# Patient Record
Sex: Female | Born: 1950
Health system: Southern US, Community
[De-identification: ages and names within clinical notes are randomized; demographics above are authoritative.]

## PROBLEM LIST (undated history)

## (undated) DIAGNOSIS — J309 Allergic rhinitis, unspecified: Secondary | ICD-10-CM

## (undated) DIAGNOSIS — F17200 Nicotine dependence, unspecified, uncomplicated: Secondary | ICD-10-CM

## (undated) DIAGNOSIS — J45909 Unspecified asthma, uncomplicated: Secondary | ICD-10-CM

## (undated) DIAGNOSIS — I10 Essential (primary) hypertension: Secondary | ICD-10-CM

## (undated) HISTORY — DX: Nicotine dependence, unspecified, uncomplicated: F17.200

## (undated) HISTORY — DX: Essential (primary) hypertension: I10

## (undated) HISTORY — DX: Allergic rhinitis, unspecified: J30.9

## (undated) HISTORY — DX: Unspecified asthma, uncomplicated: J45.909

---

## 2004-07-18 ENCOUNTER — Ambulatory Visit: Payer: Self-pay | Admitting: Family Medicine

## 2004-08-18 ENCOUNTER — Ambulatory Visit: Payer: Self-pay | Admitting: Family Medicine

## 2004-10-05 ENCOUNTER — Ambulatory Visit: Payer: Self-pay | Admitting: Family Medicine

## 2004-11-08 ENCOUNTER — Ambulatory Visit: Payer: Self-pay | Admitting: Family Medicine

## 2004-12-12 ENCOUNTER — Ambulatory Visit: Payer: Self-pay | Admitting: Family Medicine

## 2005-02-27 ENCOUNTER — Ambulatory Visit: Payer: Self-pay | Admitting: Family Medicine

## 2005-03-14 ENCOUNTER — Ambulatory Visit: Payer: Self-pay | Admitting: Family Medicine

## 2005-03-30 ENCOUNTER — Emergency Department (HOSPITAL_COMMUNITY): Admission: EM | Admit: 2005-03-30 | Discharge: 2005-03-30 | Payer: Self-pay | Admitting: Emergency Medicine

## 2005-09-04 ENCOUNTER — Ambulatory Visit: Payer: Self-pay | Admitting: Family Medicine

## 2005-10-05 ENCOUNTER — Encounter (INDEPENDENT_AMBULATORY_CARE_PROVIDER_SITE_OTHER): Payer: Self-pay | Admitting: Family Medicine

## 2005-10-05 LAB — CONVERTED CEMR LAB
TSH: 2.616 microintl units/mL
WBC, blood: 5.1 10*3/uL

## 2005-10-09 ENCOUNTER — Ambulatory Visit: Payer: Self-pay | Admitting: Family Medicine

## 2005-10-15 ENCOUNTER — Ambulatory Visit: Payer: Self-pay | Admitting: Family Medicine

## 2005-11-06 ENCOUNTER — Ambulatory Visit: Payer: Self-pay | Admitting: Family Medicine

## 2005-12-18 ENCOUNTER — Ambulatory Visit: Payer: Self-pay | Admitting: Family Medicine

## 2006-01-16 ENCOUNTER — Ambulatory Visit: Payer: Self-pay | Admitting: Family Medicine

## 2006-02-27 ENCOUNTER — Ambulatory Visit: Payer: Self-pay | Admitting: Family Medicine

## 2006-03-27 ENCOUNTER — Encounter: Payer: Self-pay | Admitting: Family Medicine

## 2006-03-27 DIAGNOSIS — J309 Allergic rhinitis, unspecified: Secondary | ICD-10-CM | POA: Insufficient documentation

## 2006-03-27 DIAGNOSIS — I10 Essential (primary) hypertension: Secondary | ICD-10-CM | POA: Insufficient documentation

## 2006-03-27 DIAGNOSIS — J45909 Unspecified asthma, uncomplicated: Secondary | ICD-10-CM | POA: Insufficient documentation

## 2006-03-27 DIAGNOSIS — E785 Hyperlipidemia, unspecified: Secondary | ICD-10-CM

## 2006-04-18 ENCOUNTER — Ambulatory Visit: Payer: Self-pay | Admitting: Family Medicine

## 2006-04-19 ENCOUNTER — Encounter (INDEPENDENT_AMBULATORY_CARE_PROVIDER_SITE_OTHER): Payer: Self-pay | Admitting: Family Medicine

## 2006-08-05 ENCOUNTER — Ambulatory Visit: Payer: Self-pay | Admitting: Family Medicine

## 2006-08-05 LAB — CONVERTED CEMR LAB: LDL Goal: 130 mg/dL

## 2006-11-04 ENCOUNTER — Ambulatory Visit: Payer: Self-pay | Admitting: Family Medicine

## 2006-11-04 ENCOUNTER — Telehealth (INDEPENDENT_AMBULATORY_CARE_PROVIDER_SITE_OTHER): Payer: Self-pay | Admitting: *Deleted

## 2006-11-04 DIAGNOSIS — F17200 Nicotine dependence, unspecified, uncomplicated: Secondary | ICD-10-CM

## 2006-11-05 ENCOUNTER — Encounter (INDEPENDENT_AMBULATORY_CARE_PROVIDER_SITE_OTHER): Payer: Self-pay | Admitting: Family Medicine

## 2006-11-05 ENCOUNTER — Telehealth (INDEPENDENT_AMBULATORY_CARE_PROVIDER_SITE_OTHER): Payer: Self-pay | Admitting: *Deleted

## 2006-11-05 LAB — CONVERTED CEMR LAB
ALT: 9 units/L (ref 0–35)
Albumin: 4.4 g/dL (ref 3.5–5.2)
Basophils Absolute: 0 10*3/uL (ref 0.0–0.1)
CO2: 23 meq/L (ref 19–32)
Calcium: 9.6 mg/dL (ref 8.4–10.5)
Chloride: 105 meq/L (ref 96–112)
Cholesterol: 194 mg/dL (ref 0–200)
Hemoglobin: 12.9 g/dL (ref 12.0–15.0)
Lymphocytes Relative: 33 % (ref 12–46)
Neutro Abs: 3.2 10*3/uL (ref 1.7–7.7)
Platelets: 281 10*3/uL (ref 150–400)
RDW: 14.6 % — ABNORMAL HIGH (ref 11.5–14.0)
Sodium: 139 meq/L (ref 135–145)
Total Protein: 7.4 g/dL (ref 6.0–8.3)
WBC: 5.6 10*3/uL (ref 4.0–10.5)

## 2006-11-08 ENCOUNTER — Encounter (INDEPENDENT_AMBULATORY_CARE_PROVIDER_SITE_OTHER): Payer: Self-pay | Admitting: Family Medicine

## 2006-11-19 ENCOUNTER — Telehealth (INDEPENDENT_AMBULATORY_CARE_PROVIDER_SITE_OTHER): Payer: Self-pay | Admitting: Family Medicine

## 2006-11-21 ENCOUNTER — Ambulatory Visit: Payer: Self-pay | Admitting: Family Medicine

## 2007-01-02 ENCOUNTER — Ambulatory Visit: Payer: Self-pay | Admitting: Gastroenterology

## 2007-01-02 ENCOUNTER — Ambulatory Visit (HOSPITAL_COMMUNITY): Admission: RE | Admit: 2007-01-02 | Discharge: 2007-01-02 | Payer: Self-pay | Admitting: Gastroenterology

## 2007-01-02 HISTORY — PX: COLONOSCOPY: SHX174

## 2007-03-07 ENCOUNTER — Telehealth (INDEPENDENT_AMBULATORY_CARE_PROVIDER_SITE_OTHER): Payer: Self-pay | Admitting: *Deleted

## 2007-03-07 ENCOUNTER — Ambulatory Visit: Payer: Self-pay | Admitting: Family Medicine

## 2007-03-07 LAB — CONVERTED CEMR LAB
CO2: 27 meq/L (ref 19–32)
Glucose, Bld: 95 mg/dL (ref 70–99)
Potassium: 3.9 meq/L (ref 3.5–5.3)
Sodium: 137 meq/L (ref 135–145)

## 2007-03-13 ENCOUNTER — Encounter (INDEPENDENT_AMBULATORY_CARE_PROVIDER_SITE_OTHER): Payer: Self-pay | Admitting: Family Medicine

## 2007-04-21 ENCOUNTER — Encounter (INDEPENDENT_AMBULATORY_CARE_PROVIDER_SITE_OTHER): Payer: Self-pay | Admitting: Family Medicine

## 2007-04-21 ENCOUNTER — Other Ambulatory Visit: Admission: RE | Admit: 2007-04-21 | Discharge: 2007-04-21 | Payer: Self-pay | Admitting: Obstetrics and Gynecology

## 2007-04-21 LAB — CONVERTED CEMR LAB: Pap Smear: NORMAL

## 2007-04-24 ENCOUNTER — Encounter: Payer: Self-pay | Admitting: Family Medicine

## 2007-12-10 ENCOUNTER — Ambulatory Visit: Payer: Self-pay | Admitting: Family Medicine

## 2007-12-10 ENCOUNTER — Telehealth (INDEPENDENT_AMBULATORY_CARE_PROVIDER_SITE_OTHER): Payer: Self-pay | Admitting: *Deleted

## 2007-12-10 LAB — CONVERTED CEMR LAB
Protein, U semiquant: NEGATIVE
Urobilinogen, UA: 2

## 2007-12-11 ENCOUNTER — Encounter (INDEPENDENT_AMBULATORY_CARE_PROVIDER_SITE_OTHER): Payer: Self-pay | Admitting: Family Medicine

## 2007-12-11 LAB — CONVERTED CEMR LAB
BUN: 14 mg/dL (ref 6–23)
Basophils Relative: 1 % (ref 0–1)
CO2: 22 meq/L (ref 19–32)
Calcium: 9.1 mg/dL (ref 8.4–10.5)
Chloride: 106 meq/L (ref 96–112)
Cholesterol: 196 mg/dL (ref 0–200)
Creatinine, Ser: 0.91 mg/dL (ref 0.40–1.20)
Glucose, Bld: 88 mg/dL (ref 70–99)
HDL: 61 mg/dL (ref >39)
Hemoglobin: 12.7 g/dL (ref 12.0–15.0)
Lymphocytes Relative: 39 % (ref 12–46)
MCHC: 34.9 g/dL (ref 30.0–36.0)
Monocytes Absolute: 0.5 10*3/uL (ref 0.1–1.0)
Monocytes Relative: 11 % (ref 3–12)
Neutro Abs: 2.3 10*3/uL (ref 1.7–7.7)
RBC: 3.93 M/uL (ref 3.87–5.11)
Total CHOL/HDL Ratio: 3.2
Triglycerides: 44 mg/dL (ref <150)

## 2007-12-12 ENCOUNTER — Encounter (INDEPENDENT_AMBULATORY_CARE_PROVIDER_SITE_OTHER): Payer: Self-pay | Admitting: Family Medicine

## 2007-12-12 LAB — CONVERTED CEMR LAB

## 2007-12-30 ENCOUNTER — Ambulatory Visit: Payer: Self-pay | Admitting: Family Medicine

## 2007-12-30 LAB — CONVERTED CEMR LAB
Bilirubin Urine: NEGATIVE
Glucose, Urine, Semiquant: NEGATIVE
Ketones, urine, test strip: NEGATIVE
Specific Gravity, Urine: <1.005
pH: 6.5

## 2008-01-05 ENCOUNTER — Encounter (INDEPENDENT_AMBULATORY_CARE_PROVIDER_SITE_OTHER): Payer: Self-pay | Admitting: Family Medicine

## 2008-03-30 ENCOUNTER — Ambulatory Visit: Payer: Self-pay | Admitting: Family Medicine

## 2008-03-30 DIAGNOSIS — F4321 Adjustment disorder with depressed mood: Secondary | ICD-10-CM

## 2008-05-13 ENCOUNTER — Other Ambulatory Visit: Admission: RE | Admit: 2008-05-13 | Discharge: 2008-05-13 | Payer: Self-pay | Admitting: Obstetrics and Gynecology

## 2008-05-13 ENCOUNTER — Encounter (INDEPENDENT_AMBULATORY_CARE_PROVIDER_SITE_OTHER): Payer: Self-pay | Admitting: Family Medicine

## 2008-05-13 LAB — CONVERTED CEMR LAB: Pap Smear: NORMAL

## 2008-06-11 ENCOUNTER — Telehealth (INDEPENDENT_AMBULATORY_CARE_PROVIDER_SITE_OTHER): Payer: Self-pay | Admitting: *Deleted

## 2008-08-09 ENCOUNTER — Ambulatory Visit: Payer: Self-pay | Admitting: Family Medicine

## 2008-11-01 ENCOUNTER — Ambulatory Visit: Payer: Self-pay | Admitting: Family Medicine

## 2008-11-03 LAB — CONVERTED CEMR LAB
ALT: 8 U/L
AST: 14 U/L
Albumin: 4.3 g/dL
Alkaline Phosphatase: 96 U/L
BUN: 13 mg/dL
CO2: 21 meq/L
Calcium: 9.4 mg/dL
Chloride: 107 meq/L
Creatinine, Ser: 0.82 mg/dL
Glucose, Bld: 102 mg/dL — ABNORMAL HIGH
HCT: 35.2 % — ABNORMAL LOW
Hemoglobin: 12.5 g/dL
MCHC: 35.5 g/dL
MCV: 93.6 fL
Platelets: 287 K/uL
Potassium: 4.3 meq/L
RBC: 3.76 M/uL — ABNORMAL LOW
RDW: 13.8 %
Sodium: 141 meq/L
Total Bilirubin: 0.4 mg/dL
Total Protein: 6.8 g/dL
WBC: 5 10*3/microliter

## 2008-12-03 ENCOUNTER — Ambulatory Visit: Payer: Self-pay | Admitting: Family Medicine

## 2008-12-03 DIAGNOSIS — K149 Disease of tongue, unspecified: Secondary | ICD-10-CM | POA: Insufficient documentation

## 2008-12-08 ENCOUNTER — Encounter (INDEPENDENT_AMBULATORY_CARE_PROVIDER_SITE_OTHER): Payer: Self-pay | Admitting: Family Medicine

## 2009-09-08 ENCOUNTER — Other Ambulatory Visit: Admission: RE | Admit: 2009-09-08 | Discharge: 2009-09-08 | Payer: Self-pay | Admitting: Obstetrics and Gynecology

## 2010-02-24 ENCOUNTER — Ambulatory Visit (HOSPITAL_COMMUNITY): Admission: RE | Admit: 2010-02-24 | Discharge: 2010-02-24 | Payer: Self-pay | Admitting: Internal Medicine

## 2010-03-29 ENCOUNTER — Ambulatory Visit (HOSPITAL_COMMUNITY)
Admission: RE | Admit: 2010-03-29 | Discharge: 2010-03-29 | Payer: Self-pay | Source: Home / Self Care | Attending: Internal Medicine | Admitting: Internal Medicine

## 2010-05-21 LAB — CONVERTED CEMR LAB: Pap Smear: NORMAL

## 2010-05-23 NOTE — Letter (Signed)
Summary: Historic Patient File  Historic Patient File   Imported By: Lind Guest 01/23/2010 10:45:14  _____________________________________________________________________  External Attachment:    Type:   Image     Comment:   External Document

## 2010-09-05 NOTE — Op Note (Signed)
[name], [name]             ACCOUNT NO.:  [id number]   MEDICAL RECORD NO.:  [id number]          PATIENT TYPE:  AMB   LOCATION:  DAY                           FACILITY:  [facility]   PHYSICIAN:  Kassie Mends, M.D.      DATE OF BIRTH:  January 12, 1951   DATE OF PROCEDURE:  01/02/2007  DATE OF DISCHARGE:                               OPERATIVE REPORT   PROCEDURE:  Colonoscopy.   INDICATION FOR EXAM:  [name] is a 60 year old female who presents  for average age colon cancer screening.   FINDINGS:  1. Tortuous sigmoid and descending colon. The descending and sigmoid      colon between 40 and 60 cm would not insufflate, most likely      secondary to spasm. Visualization of the mucosa in that segment was      limited. Otherwise, no polyps, masses, inflammatory changes,      diverticula or AVMs seen.  2. Normal retroflexed view of the rectum.   RECOMMENDATIONS:  1. Screening colonoscopy in 5 years due to the limited visualization      of the left colon.  2. She should follow a high fiber diet. High fiber diet is associated      with decreased risk in colon cancer.   MEDICATIONS:  1. Demerol 50 mg IV.  2. Versed 4 mg IV.   PROCEDURE TECHNIQUE:  Physical exam was performed, informed consent was  obtained from the patient. I explained the benefits, risks and  alternatives to the procedure. The patient was connected to the monitor,  placed in the left lateral position. Continuous oxygen was provided by  nasal cannula and IV medicine administered through an indwelling  cannula. After administration of sedation and rectal exam, the patient's  rectum was intubated and the scope was advanced under direct  visualization to the cecum. The scope was removed slowly by carefully  examining the color, texture, anatomy and integrity of the mucosa on the  way out. The patient was recovered in endoscopy and discharged home in  satisfactory condition.      Kassie Mends, M.D.  Electronically  Signed    SM/MEDQ  D:  01/02/2007  T:  01/02/2007  Job:  [id number]   cc:   Franchot Heidelberg, M.D.

## 2011-12-05 ENCOUNTER — Encounter: Payer: Self-pay | Admitting: Gastroenterology

## 2011-12-28 ENCOUNTER — Encounter: Payer: Self-pay | Admitting: Gastroenterology

## 2011-12-31 ENCOUNTER — Encounter: Payer: Self-pay | Admitting: Gastroenterology

## 2011-12-31 ENCOUNTER — Ambulatory Visit (INDEPENDENT_AMBULATORY_CARE_PROVIDER_SITE_OTHER): Payer: Self-pay | Admitting: Gastroenterology

## 2011-12-31 ENCOUNTER — Ambulatory Visit: Payer: Self-pay | Admitting: Urgent Care

## 2011-12-31 VITALS — BP 118/69 | HR 74 | Temp 97.2°F | Ht 64.5 in | Wt 125.6 lb

## 2011-12-31 DIAGNOSIS — Z1211 Encounter for screening for malignant neoplasm of colon: Secondary | ICD-10-CM

## 2011-12-31 NOTE — Progress Notes (Signed)
Pt presented today due to receiving a letter informing she is due for routine screening colonoscopy. Last done in 2008 with torturous sigmoid and descending colon. Could not completely insufflate. Visualization limited, no polyps or abnormalities. Recommendations were to repeat screening in 5 years due to limited visualization. However, she does not have insurance currently and will not have for 90 days. No abdominal pain, no N/V. No blood in stool, no change in bowel habits. She does not want to proceed with a TCS at this time. She is agreeable to doing this in the future, but just not right now. She does have forms to fill out for [place] assistance. I discussed with her briefly the importance of pursuing this at least before the end of the year is up. She states she would rather wait for now, but she will call if [place] assistance is able to pick up prior to receiving insurance.   As we are not scheduling anything and she does not want to pursue anything at this time, also not having problems, I have asked her to just call me when she is ready to proceed. At that time, we will complete a full office visit and set her up for the procedure. I did not charge her for today's visit, and I was in the room for her no longer than 5-10 minutes.

## 2014-07-30 ENCOUNTER — Other Ambulatory Visit (HOSPITAL_COMMUNITY): Payer: Self-pay | Admitting: Internal Medicine

## 2014-07-30 DIAGNOSIS — Z1231 Encounter for screening mammogram for malignant neoplasm of breast: Secondary | ICD-10-CM

## 2014-08-27 ENCOUNTER — Ambulatory Visit (HOSPITAL_COMMUNITY)
Admission: RE | Admit: 2014-08-27 | Discharge: 2014-08-27 | Disposition: A | Discharge status: Home / Self Care | Payer: 59 | Source: Ambulatory Visit | Attending: Internal Medicine | Admitting: Internal Medicine

## 2014-08-27 DIAGNOSIS — Z1231 Encounter for screening mammogram for malignant neoplasm of breast: Secondary | ICD-10-CM | POA: Insufficient documentation

## 2015-01-26 ENCOUNTER — Emergency Department (HOSPITAL_COMMUNITY)
Admission: EM | Admit: 2015-01-26 | Discharge: 2015-01-26 | Disposition: A | Discharge status: Home / Self Care | Payer: 59 | Attending: Emergency Medicine | Admitting: Emergency Medicine

## 2015-01-26 ENCOUNTER — Encounter (HOSPITAL_COMMUNITY): Payer: Self-pay | Admitting: Emergency Medicine

## 2015-01-26 DIAGNOSIS — Y9289 Other specified places as the place of occurrence of the external cause: Secondary | ICD-10-CM | POA: Diagnosis not present

## 2015-01-26 DIAGNOSIS — X58XXXA Exposure to other specified factors, initial encounter: Secondary | ICD-10-CM | POA: Insufficient documentation

## 2015-01-26 DIAGNOSIS — J45909 Unspecified asthma, uncomplicated: Secondary | ICD-10-CM | POA: Insufficient documentation

## 2015-01-26 DIAGNOSIS — T783XXA Angioneurotic edema, initial encounter: Secondary | ICD-10-CM | POA: Insufficient documentation

## 2015-01-26 DIAGNOSIS — R202 Paresthesia of skin: Secondary | ICD-10-CM | POA: Diagnosis present

## 2015-01-26 DIAGNOSIS — Z720 Tobacco use: Secondary | ICD-10-CM | POA: Diagnosis not present

## 2015-01-26 DIAGNOSIS — I10 Essential (primary) hypertension: Secondary | ICD-10-CM | POA: Insufficient documentation

## 2015-01-26 DIAGNOSIS — Y998 Other external cause status: Secondary | ICD-10-CM | POA: Insufficient documentation

## 2015-01-26 DIAGNOSIS — Y9389 Activity, other specified: Secondary | ICD-10-CM | POA: Diagnosis not present

## 2015-01-26 DIAGNOSIS — Z79899 Other long term (current) drug therapy: Secondary | ICD-10-CM | POA: Insufficient documentation

## 2015-01-26 MED ORDER — PREDNISONE 50 MG PO TABS: ORAL_TABLET | ORAL | Status: DC

## 2015-01-26 MED ORDER — DIPHENHYDRAMINE HCL 50 MG/ML IJ SOLN
25.0000 mg | Freq: Once | INTRAMUSCULAR | Status: AC
  Administered 2015-01-26: 25 mg via INTRAVENOUS
  Filled 2015-01-26: qty 1

## 2015-01-26 MED ORDER — FAMOTIDINE IN NACL 20-0.9 MG/50ML-% IV SOLN
20.0000 mg | Freq: Once | INTRAVENOUS | Status: AC
  Administered 2015-01-26: 20 mg via INTRAVENOUS
  Filled 2015-01-26: qty 50

## 2015-01-26 MED ORDER — SODIUM CHLORIDE 0.9 % IV BOLUS (SEPSIS)
500.0000 mL | Freq: Once | INTRAVENOUS | Status: AC
  Administered 2015-01-26: 500 mL via INTRAVENOUS

## 2015-01-26 MED ORDER — LORATADINE 10 MG PO TABS
10.0000 mg | ORAL_TABLET | Freq: Once | ORAL | Status: AC
  Administered 2015-01-26: 10 mg via ORAL
  Filled 2015-01-26: qty 1

## 2015-01-26 MED ORDER — METHYLPREDNISOLONE SODIUM SUCC 125 MG IJ SOLR
125.0000 mg | Freq: Once | INTRAMUSCULAR | Status: AC
  Administered 2015-01-26: 125 mg via INTRAVENOUS
  Filled 2015-01-26: qty 2

## 2015-01-26 NOTE — ED Notes (Signed)
Small amount of decrease in swelling noted. MD made aware of pt status and is going to re-evaluate pt.

## 2015-01-26 NOTE — ED Notes (Signed)
Patient has significant swelling noted to upper lip starting today. Denies difficulty breathing or swallowing. No swelling noted to tongue.

## 2015-01-26 NOTE — ED Provider Notes (Signed)
CSN: [id number]     Arrival date & time 01/26/15  1512 History   First MD Initiated Contact with Patient 01/26/15 1540     Chief Complaint  Patient presents with  . Angioedema     (Consider location/radiation/quality/duration/timing/severity/associated sxs/prior Treatment) HPI....Marland Kitchen Swelling of upper lip for several hours. Patient initially felt a tingling sensation on her right upper lip earlier today. As the day progressed, the swelling persisted and worsened. No prodromal illnesses. No known food or drug allergies. She has tried Benadryl at home with minimal relief. She is able to swallow. Normal respirations.  This never happened before. No history of ACEI ingestion.  Past Medical History  Diagnosis Date  . HTN (hypertension)   . Asthma   . Allergic rhinitis   . Tobacco use disorder    Past Surgical History  Procedure Laterality Date  . Colonoscopy  01/02/2007    Tortuous sigmoid and descending colon. The descending and sigmoid  colon between 40 and 60 cm would not insufflate, most likely secondary to spasm. Visualization of the mucosa in that segment was  limited. Otherwise, no polyps, masses, inflammatory changes, diverticula or AVMs seen/Normal retroflexed view of the rectum.   Family History  Problem Relation Age of Onset  . Colon cancer Neg Hx    Social History  Substance Use Topics  . Smoking status: Current Every Day Smoker -- 0.50 packs/day    Types: Cigarettes  . Smokeless tobacco: None  . Alcohol Use: No   OB History    No data available     Review of Systems  All other systems reviewed and are negative.     Allergies  Sulfonamide derivatives  Home Medications   Prior to Admission medications   Medication Sig Start Date End Date Taking? Authorizing Provider  amLODipine-benazepril (LOTREL) 10-20 MG capsule Take 1 capsule by mouth daily.   Yes Historical Provider, MD  diphenhydrAMINE (BENADRYL) 50 MG capsule Take 100 mg by mouth every 8 (eight) hours as  needed for itching or allergies.   Yes Historical Provider, MD  predniSONE (DELTASONE) 50 MG tablet 1 tablet daily for days, one half tablet daily for 4 days 01/26/15   Donnetta Hutching, MD   BP 140/85 mmHg  Pulse 79  Temp(Src) 98.4 F (36.9 C) (Oral)  Resp 20  Ht  (1.626 m)  Wt 129 lb (58.514 kg)  BMI 22.13 kg/m2  SpO2 100% Physical Exam  Constitutional: She is oriented to person, place, and time. She appears well-developed and well-nourished.  HENT:  Head: Normocephalic and atraumatic.  Upper lip dramatically swollen/edematous  Eyes: Conjunctivae and EOM are normal. Pupils are equal, round, and reactive to light.  Neck: Normal range of motion. Neck supple.  Cardiovascular: Normal rate and regular rhythm.   Pulmonary/Chest: Effort normal and breath sounds normal.  Abdominal: Soft. Bowel sounds are normal.  Musculoskeletal: Normal range of motion.  Neurological: She is alert and oriented to person, place, and time.  Skin: Skin is warm and dry.  Psychiatric: She has a normal mood and affect. Her behavior is normal.  Nursing note and vitals reviewed.   ED Course  Procedures (including critical care time) Labs Review Labs Reviewed - No data to display  Imaging Review No results found. I have personally reviewed and evaluated these images and lab results as part of my medical decision-making.   EKG Interpretation None      MDM   Final diagnoses:  Angioedema, initial encounter    No respiratory distress.  No difficulty swallowing. Swelling has reduced after IV steroids, IV Pepcid, IV Benadryl. Discharge medication prednisone. Recommend daily Claritin.    Donnetta Hutching, MD 01/26/15 [phone]

## 2015-01-26 NOTE — Discharge Instructions (Signed)
Ice pack to lip, prescription for prednisone to start tomorrow. Can also take 1 Claritin tablet daily.  Return if worse

## 2015-01-26 NOTE — ED Notes (Signed)
MD at bedside. 

## 2016-03-09 DIAGNOSIS — J410 Simple chronic bronchitis: Secondary | ICD-10-CM | POA: Diagnosis not present

## 2016-03-09 DIAGNOSIS — I10 Essential (primary) hypertension: Secondary | ICD-10-CM | POA: Diagnosis not present

## 2016-03-13 ENCOUNTER — Other Ambulatory Visit (HOSPITAL_COMMUNITY): Payer: Self-pay | Admitting: Internal Medicine

## 2016-03-13 DIAGNOSIS — F17200 Nicotine dependence, unspecified, uncomplicated: Secondary | ICD-10-CM

## 2016-03-23 ENCOUNTER — Ambulatory Visit (HOSPITAL_COMMUNITY)
Admission: RE | Admit: 2016-03-23 | Discharge: 2016-03-23 | Disposition: A | Discharge status: Home / Self Care | Payer: Medicare Other | Source: Ambulatory Visit | Attending: Internal Medicine | Admitting: Internal Medicine

## 2016-03-23 DIAGNOSIS — F17210 Nicotine dependence, cigarettes, uncomplicated: Secondary | ICD-10-CM | POA: Insufficient documentation

## 2016-03-23 DIAGNOSIS — F17200 Nicotine dependence, unspecified, uncomplicated: Secondary | ICD-10-CM

## 2016-03-23 DIAGNOSIS — I700 Atherosclerosis of aorta: Secondary | ICD-10-CM | POA: Insufficient documentation

## 2016-03-23 DIAGNOSIS — Z87891 Personal history of nicotine dependence: Secondary | ICD-10-CM | POA: Diagnosis not present

## 2016-03-23 DIAGNOSIS — J439 Emphysema, unspecified: Secondary | ICD-10-CM | POA: Insufficient documentation

## 2016-03-23 DIAGNOSIS — Z122 Encounter for screening for malignant neoplasm of respiratory organs: Secondary | ICD-10-CM | POA: Diagnosis not present

## 2016-04-06 DIAGNOSIS — J410 Simple chronic bronchitis: Secondary | ICD-10-CM | POA: Diagnosis not present

## 2016-04-06 DIAGNOSIS — I10 Essential (primary) hypertension: Secondary | ICD-10-CM | POA: Diagnosis not present

## 2016-07-19 ENCOUNTER — Other Ambulatory Visit (HOSPITAL_COMMUNITY): Payer: Self-pay | Admitting: Internal Medicine

## 2016-07-19 DIAGNOSIS — Z1231 Encounter for screening mammogram for malignant neoplasm of breast: Secondary | ICD-10-CM

## 2016-07-19 DIAGNOSIS — Z0000 Encounter for general adult medical examination without abnormal findings: Secondary | ICD-10-CM | POA: Diagnosis not present

## 2016-07-19 DIAGNOSIS — J410 Simple chronic bronchitis: Secondary | ICD-10-CM | POA: Diagnosis not present

## 2016-07-19 DIAGNOSIS — I110 Hypertensive heart disease with heart failure: Secondary | ICD-10-CM | POA: Diagnosis not present

## 2016-07-19 DIAGNOSIS — I10 Essential (primary) hypertension: Secondary | ICD-10-CM | POA: Diagnosis not present

## 2016-07-27 ENCOUNTER — Ambulatory Visit (HOSPITAL_COMMUNITY)
Admission: RE | Admit: 2016-07-27 | Discharge: 2016-07-27 | Disposition: A | Discharge status: Home / Self Care | Payer: Medicare Other | Source: Ambulatory Visit | Attending: Internal Medicine | Admitting: Internal Medicine

## 2016-07-27 DIAGNOSIS — Z1231 Encounter for screening mammogram for malignant neoplasm of breast: Secondary | ICD-10-CM | POA: Diagnosis not present

## 2016-08-03 ENCOUNTER — Ambulatory Visit (HOSPITAL_COMMUNITY): Payer: Medicare Other

## 2016-12-20 ENCOUNTER — Encounter (HOSPITAL_COMMUNITY): Payer: Self-pay | Admitting: Emergency Medicine

## 2016-12-20 ENCOUNTER — Emergency Department (HOSPITAL_COMMUNITY)
Admission: EM | Admit: 2016-12-20 | Discharge: 2016-12-20 | Disposition: A | Discharge status: Home / Self Care | Payer: Medicare Other | Attending: Emergency Medicine | Admitting: Emergency Medicine

## 2016-12-20 DIAGNOSIS — I10 Essential (primary) hypertension: Secondary | ICD-10-CM | POA: Diagnosis not present

## 2016-12-20 DIAGNOSIS — F17210 Nicotine dependence, cigarettes, uncomplicated: Secondary | ICD-10-CM | POA: Insufficient documentation

## 2016-12-20 DIAGNOSIS — J45909 Unspecified asthma, uncomplicated: Secondary | ICD-10-CM | POA: Diagnosis not present

## 2016-12-20 DIAGNOSIS — T7840XA Allergy, unspecified, initial encounter: Secondary | ICD-10-CM | POA: Diagnosis not present

## 2016-12-20 DIAGNOSIS — Z79899 Other long term (current) drug therapy: Secondary | ICD-10-CM | POA: Diagnosis not present

## 2016-12-20 DIAGNOSIS — R600 Localized edema: Secondary | ICD-10-CM | POA: Diagnosis present

## 2016-12-20 DIAGNOSIS — T7849XA Other allergy, initial encounter: Secondary | ICD-10-CM | POA: Diagnosis not present

## 2016-12-20 MED ORDER — PREDNISONE 20 MG PO TABS: ORAL_TABLET | ORAL | 0 refills | Status: AC

## 2016-12-20 MED ORDER — METHYLPREDNISOLONE SODIUM SUCC 125 MG IJ SOLR
125.0000 mg | Freq: Once | INTRAMUSCULAR | Status: AC
  Administered 2016-12-20: 125 mg via INTRAVENOUS
  Filled 2016-12-20: qty 2

## 2016-12-20 MED ORDER — DIPHENHYDRAMINE HCL 50 MG/ML IJ SOLN
25.0000 mg | Freq: Once | INTRAMUSCULAR | Status: AC
  Administered 2016-12-20: 25 mg via INTRAVENOUS
  Filled 2016-12-20: qty 1

## 2016-12-20 MED ORDER — FAMOTIDINE 20 MG PO TABS: 20.0000 mg | ORAL_TABLET | Freq: Two times a day (BID) | ORAL | 0 refills | Status: AC

## 2016-12-20 MED ORDER — FAMOTIDINE IN NACL 20-0.9 MG/50ML-% IV SOLN
20.0000 mg | Freq: Once | INTRAVENOUS | Status: AC
  Administered 2016-12-20: 20 mg via INTRAVENOUS
  Filled 2016-12-20: qty 50

## 2016-12-20 NOTE — ED Triage Notes (Signed)
Pt reports lips swelling since yesterday with no other symptoms.  Pt takes Benazepril.

## 2016-12-20 NOTE — ED Provider Notes (Signed)
[facility]-EMERGENCY DEPT Provider Note   CSN: [id number] Arrival date & time: 12/20/16  1411     History   Chief Complaint Chief Complaint  Patient presents with  . Oral Swelling    HPI [name] is a 66 y.o. female.  Patient states that her upper lip started swelling no other problems.   The history is provided by the patient. No language interpreter was used.  Allergic Reaction  Presenting symptoms: swelling   Presenting symptoms: no difficulty breathing and no rash   Severity:  Moderate Prior episodes: 1 prior episode. Context: not animal exposure   Relieved by:  Nothing Worsened by:  Nothing Ineffective treatments:  Antihistamines   Past Medical History:  Diagnosis Date  . Allergic rhinitis   . Asthma   . HTN (hypertension)   . Tobacco use disorder     Patient Active Problem List   Diagnosis Date Noted  . TONGUE DISORDER 12/03/2008  . DEPRESSION, SITUATIONAL 03/30/2008  . TOBACCO ABUSE 11/04/2006  . HYPERLIPIDEMIA 03/27/2006  . HYPERTENSION 03/27/2006  . ALLERGIC RHINITIS 03/27/2006  . ASTHMA 03/27/2006    Past Surgical History:  Procedure Laterality Date  . COLONOSCOPY  01/02/2007   Tortuous sigmoid and descending colon. The descending and sigmoid  colon between 40 and 60 cm would not insufflate, most likely secondary to spasm. Visualization of the mucosa in that segment was  limited. Otherwise, no polyps, masses, inflammatory changes, diverticula or AVMs seen/Normal retroflexed view of the rectum.    OB History    No data available       Home Medications    Prior to Admission medications   Medication Sig Start Date End Date Taking? Authorizing Provider  acetaminophen (TYLENOL) 500 MG tablet Take 500 mg by mouth every 6 (six) hours as needed.   Yes [provider]  amLODipine-benazepril (LOTREL) 10-20 MG capsule Take 1 capsule by mouth daily.   Yes [provider]  diphenhydrAMINE (BENADRYL) 50 MG capsule Take 100 mg by  mouth every 8 (eight) hours as needed for itching or allergies.   Yes [provider]  famotidine (PEPCID) 20 MG tablet Take 1 tablet (20 mg total) by mouth 2 (two) times daily. 12/20/16   Bethann Berkshire, MD  predniSONE (DELTASONE) 20 MG tablet 2 tabs po daily x 3 days 12/20/16   Bethann Berkshire, MD    Family History Family History  Problem Relation Age of Onset  . Colon cancer Neg Hx     Social History Social History  Substance Use Topics  . Smoking status: Current Every Day Smoker    Packs/day: 0.50    Types: Cigarettes  . Smokeless tobacco: Not on file  . Alcohol use No     Allergies   Sulfonamide derivatives   Review of Systems Review of Systems  Constitutional: Negative for appetite change and fatigue.  HENT: Negative for congestion, ear discharge and sinus pressure.        Swelling to the upper lip  Eyes: Negative for discharge.  Respiratory: Negative for cough.   Cardiovascular: Negative for chest pain.  Gastrointestinal: Negative for abdominal pain and diarrhea.  Genitourinary: Negative for frequency and hematuria.  Musculoskeletal: Negative for back pain.  Skin: Negative for rash.  Neurological: Negative for seizures and headaches.  Psychiatric/Behavioral: Negative for hallucinations.     Physical Exam Updated Vital Signs BP (!) 158/93   Pulse 79   Temp 98.2 F (36.8 C) (Oral)   Resp 16   Ht 5\' 5"  (  1.651 m)   Wt 58.5 kg (129 lb)   SpO2 100%   BMI 21.47 kg/m   Physical Exam  Constitutional: She is oriented to person, place, and time. She appears well-developed.  HENT:  Head: Normocephalic.  Moderate swelling to upper lip but rest of oropharynx negative  Eyes: Conjunctivae and EOM are normal. No scleral icterus.  Neck: Neck supple. No thyromegaly present.  Cardiovascular: Normal rate and regular rhythm.  Exam reveals no gallop and no friction rub.   No murmur heard. Pulmonary/Chest: No stridor. She has no wheezes. She has no rales. She  exhibits no tenderness.  Abdominal: She exhibits no distension. There is no tenderness. There is no rebound.  Musculoskeletal: Normal range of motion. She exhibits no edema.  Lymphadenopathy:    She has no cervical adenopathy.  Neurological: She is oriented to person, place, and time. She exhibits normal muscle tone. Coordination normal.  Skin: No rash noted. No erythema.  Psychiatric: She has a normal mood and affect. Her behavior is normal.     ED Treatments / Results  Labs (all labs ordered are listed, but only abnormal results are displayed) Labs Reviewed - No data to display  EKG  EKG Interpretation None       Radiology No results found.  Procedures Procedures (including critical care time)  Medications Ordered in ED Medications  diphenhydrAMINE (BENADRYL) injection 25 mg (25 mg Intravenous Given 12/20/16 1441)  famotidine (PEPCID) IVPB 20 mg premix (0 mg Intravenous Stopped 12/20/16 1557)  methylPREDNISolone sodium succinate (SOLU-MEDROL) 125 mg/2 mL injection 125 mg (125 mg Intravenous Given 12/20/16 1440)     Initial Impression / Assessment and Plan / ED Course  I have reviewed the triage vital signs and the nursing notes.  Pertinent labs & imaging results that were available during my care of the patient were reviewed by me and considered in my medical decision making (see chart for details).     Patient was treated for allergic reactions in the emergency department and she improved. She was discharged home with Pepcid prednisone and will take Benadryl also. She will stop taking her blood pressure medicine and contact her family doctor tomorrow  Final Clinical Impressions(s) / ED Diagnoses   Final diagnoses:  Allergic reaction, initial encounter    New Prescriptions New Prescriptions   FAMOTIDINE (PEPCID) 20 MG TABLET    Take 1 tablet (20 mg total) by mouth 2 (two) times daily.   PREDNISONE (DELTASONE) 20 MG TABLET    2 tabs po daily x 3 days       Bethann BerkshireZammit, Carless Slatten, MD 12/20/16 [phone]

## 2016-12-20 NOTE — Discharge Instructions (Signed)
Take benadryl for itching or swelling.  Stop taking your bp med.   Call your md tomorrow for different bp meds

## 2016-12-21 DIAGNOSIS — T783XXD Angioneurotic edema, subsequent encounter: Secondary | ICD-10-CM | POA: Diagnosis not present

## 2016-12-21 DIAGNOSIS — I10 Essential (primary) hypertension: Secondary | ICD-10-CM | POA: Diagnosis not present

## 2017-03-22 DIAGNOSIS — I10 Essential (primary) hypertension: Secondary | ICD-10-CM | POA: Diagnosis not present

## 2017-03-22 DIAGNOSIS — J410 Simple chronic bronchitis: Secondary | ICD-10-CM | POA: Diagnosis not present

## 2017-07-19 DIAGNOSIS — J410 Simple chronic bronchitis: Secondary | ICD-10-CM | POA: Diagnosis not present

## 2017-07-19 DIAGNOSIS — I110 Hypertensive heart disease with heart failure: Secondary | ICD-10-CM | POA: Diagnosis not present

## 2017-07-19 DIAGNOSIS — Z1331 Encounter for screening for depression: Secondary | ICD-10-CM | POA: Diagnosis not present

## 2017-07-19 DIAGNOSIS — R739 Hyperglycemia, unspecified: Secondary | ICD-10-CM | POA: Diagnosis not present

## 2017-07-19 DIAGNOSIS — Z0000 Encounter for general adult medical examination without abnormal findings: Secondary | ICD-10-CM | POA: Diagnosis not present

## 2017-07-19 DIAGNOSIS — Z23 Encounter for immunization: Secondary | ICD-10-CM | POA: Diagnosis not present

## 2017-07-19 DIAGNOSIS — I10 Essential (primary) hypertension: Secondary | ICD-10-CM | POA: Diagnosis not present

## 2017-07-19 DIAGNOSIS — Z1389 Encounter for screening for other disorder: Secondary | ICD-10-CM | POA: Diagnosis not present

## 2017-10-09 DIAGNOSIS — H2513 Age-related nuclear cataract, bilateral: Secondary | ICD-10-CM | POA: Diagnosis not present

## 2018-12-23 ENCOUNTER — Other Ambulatory Visit (HOSPITAL_COMMUNITY): Payer: Self-pay | Admitting: Internal Medicine

## 2018-12-23 DIAGNOSIS — Z1231 Encounter for screening mammogram for malignant neoplasm of breast: Secondary | ICD-10-CM

## 2019-01-02 ENCOUNTER — Inpatient Hospital Stay (HOSPITAL_COMMUNITY): Admission: RE | Admit: 2019-01-02 | Payer: Medicare Other | Source: Ambulatory Visit

## 2019-01-16 ENCOUNTER — Ambulatory Visit (HOSPITAL_COMMUNITY): Payer: Medicare Other

## 2019-01-23 ENCOUNTER — Other Ambulatory Visit: Payer: Self-pay

## 2019-01-23 ENCOUNTER — Ambulatory Visit (HOSPITAL_COMMUNITY)
Admission: RE | Admit: 2019-01-23 | Discharge: 2019-01-23 | Disposition: A | Discharge status: Home / Self Care | Payer: Medicare Other | Source: Ambulatory Visit | Attending: Internal Medicine | Admitting: Internal Medicine

## 2019-01-23 DIAGNOSIS — Z1231 Encounter for screening mammogram for malignant neoplasm of breast: Secondary | ICD-10-CM | POA: Insufficient documentation

## 2019-02-20 ENCOUNTER — Other Ambulatory Visit (HOSPITAL_COMMUNITY): Payer: Self-pay | Admitting: Internal Medicine

## 2019-02-20 DIAGNOSIS — Z0001 Encounter for general adult medical examination with abnormal findings: Secondary | ICD-10-CM | POA: Diagnosis not present

## 2019-02-20 DIAGNOSIS — Z23 Encounter for immunization: Secondary | ICD-10-CM | POA: Diagnosis not present

## 2019-02-20 DIAGNOSIS — I10 Essential (primary) hypertension: Secondary | ICD-10-CM | POA: Diagnosis not present

## 2019-02-20 DIAGNOSIS — Z1389 Encounter for screening for other disorder: Secondary | ICD-10-CM | POA: Diagnosis not present

## 2019-02-20 DIAGNOSIS — Z780 Asymptomatic menopausal state: Secondary | ICD-10-CM

## 2019-02-20 DIAGNOSIS — J410 Simple chronic bronchitis: Secondary | ICD-10-CM | POA: Diagnosis not present

## 2019-03-02 ENCOUNTER — Other Ambulatory Visit (HOSPITAL_COMMUNITY): Payer: Medicare Other

## 2019-03-13 ENCOUNTER — Ambulatory Visit (HOSPITAL_COMMUNITY)
Admission: RE | Admit: 2019-03-13 | Discharge: 2019-03-13 | Disposition: A | Discharge status: Home / Self Care | Payer: Medicare Other | Source: Ambulatory Visit | Attending: Internal Medicine | Admitting: Internal Medicine

## 2019-03-13 ENCOUNTER — Other Ambulatory Visit: Payer: Self-pay

## 2019-03-13 DIAGNOSIS — Z780 Asymptomatic menopausal state: Secondary | ICD-10-CM | POA: Insufficient documentation

## 2019-03-13 DIAGNOSIS — M8589 Other specified disorders of bone density and structure, multiple sites: Secondary | ICD-10-CM | POA: Diagnosis not present

## 2019-03-23 DIAGNOSIS — J410 Simple chronic bronchitis: Secondary | ICD-10-CM | POA: Diagnosis not present

## 2019-03-23 DIAGNOSIS — I10 Essential (primary) hypertension: Secondary | ICD-10-CM | POA: Diagnosis not present

## 2019-04-22 DIAGNOSIS — J410 Simple chronic bronchitis: Secondary | ICD-10-CM | POA: Diagnosis not present

## 2019-04-22 DIAGNOSIS — I10 Essential (primary) hypertension: Secondary | ICD-10-CM | POA: Diagnosis not present

## 2019-05-21 DIAGNOSIS — I10 Essential (primary) hypertension: Secondary | ICD-10-CM | POA: Diagnosis not present

## 2019-05-21 DIAGNOSIS — J410 Simple chronic bronchitis: Secondary | ICD-10-CM | POA: Diagnosis not present

## 2019-07-17 DIAGNOSIS — Z0001 Encounter for general adult medical examination with abnormal findings: Secondary | ICD-10-CM | POA: Diagnosis not present

## 2019-07-17 DIAGNOSIS — I10 Essential (primary) hypertension: Secondary | ICD-10-CM | POA: Diagnosis not present

## 2019-07-17 DIAGNOSIS — Z1389 Encounter for screening for other disorder: Secondary | ICD-10-CM | POA: Diagnosis not present

## 2019-07-17 DIAGNOSIS — F17290 Nicotine dependence, other tobacco product, uncomplicated: Secondary | ICD-10-CM | POA: Diagnosis not present

## 2019-07-17 DIAGNOSIS — J410 Simple chronic bronchitis: Secondary | ICD-10-CM | POA: Diagnosis not present

## 2019-07-17 DIAGNOSIS — F17210 Nicotine dependence, cigarettes, uncomplicated: Secondary | ICD-10-CM | POA: Diagnosis not present

## 2019-08-17 DIAGNOSIS — H2513 Age-related nuclear cataract, bilateral: Secondary | ICD-10-CM | POA: Diagnosis not present

## 2019-08-17 DIAGNOSIS — H43812 Vitreous degeneration, left eye: Secondary | ICD-10-CM | POA: Diagnosis not present

## 2019-08-17 DIAGNOSIS — I10 Essential (primary) hypertension: Secondary | ICD-10-CM | POA: Diagnosis not present

## 2019-08-17 DIAGNOSIS — J410 Simple chronic bronchitis: Secondary | ICD-10-CM | POA: Diagnosis not present

## 2019-08-24 DIAGNOSIS — I10 Essential (primary) hypertension: Secondary | ICD-10-CM | POA: Diagnosis not present

## 2019-08-24 DIAGNOSIS — Z0001 Encounter for general adult medical examination with abnormal findings: Secondary | ICD-10-CM | POA: Diagnosis not present

## 2019-09-16 DIAGNOSIS — I10 Essential (primary) hypertension: Secondary | ICD-10-CM | POA: Diagnosis not present

## 2019-10-17 DIAGNOSIS — I10 Essential (primary) hypertension: Secondary | ICD-10-CM | POA: Diagnosis not present

## 2019-11-16 DIAGNOSIS — I10 Essential (primary) hypertension: Secondary | ICD-10-CM | POA: Diagnosis not present

## 2019-12-17 DIAGNOSIS — J410 Simple chronic bronchitis: Secondary | ICD-10-CM | POA: Diagnosis not present

## 2019-12-17 DIAGNOSIS — I10 Essential (primary) hypertension: Secondary | ICD-10-CM | POA: Diagnosis not present

## 2020-01-01 ENCOUNTER — Other Ambulatory Visit (HOSPITAL_COMMUNITY): Payer: Self-pay | Admitting: Internal Medicine

## 2020-01-01 DIAGNOSIS — Z1231 Encounter for screening mammogram for malignant neoplasm of breast: Secondary | ICD-10-CM

## 2020-01-01 DIAGNOSIS — J410 Simple chronic bronchitis: Secondary | ICD-10-CM | POA: Diagnosis not present

## 2020-01-01 DIAGNOSIS — I10 Essential (primary) hypertension: Secondary | ICD-10-CM | POA: Diagnosis not present

## 2020-01-01 DIAGNOSIS — F17290 Nicotine dependence, other tobacco product, uncomplicated: Secondary | ICD-10-CM | POA: Diagnosis not present

## 2020-01-29 ENCOUNTER — Ambulatory Visit (HOSPITAL_COMMUNITY): Payer: Medicare Other

## 2020-01-31 DIAGNOSIS — J410 Simple chronic bronchitis: Secondary | ICD-10-CM | POA: Diagnosis not present

## 2020-01-31 DIAGNOSIS — I10 Essential (primary) hypertension: Secondary | ICD-10-CM | POA: Diagnosis not present

## 2020-02-05 ENCOUNTER — Other Ambulatory Visit: Payer: Self-pay

## 2020-02-05 ENCOUNTER — Ambulatory Visit (HOSPITAL_COMMUNITY)
Admission: RE | Admit: 2020-02-05 | Discharge: 2020-02-05 | Disposition: A | Discharge status: Home / Self Care | Payer: Medicare Other | Source: Ambulatory Visit | Attending: Internal Medicine | Admitting: Internal Medicine

## 2020-02-05 DIAGNOSIS — Z1231 Encounter for screening mammogram for malignant neoplasm of breast: Secondary | ICD-10-CM | POA: Insufficient documentation

## 2020-03-02 DIAGNOSIS — I10 Essential (primary) hypertension: Secondary | ICD-10-CM | POA: Diagnosis not present

## 2020-03-02 DIAGNOSIS — J410 Simple chronic bronchitis: Secondary | ICD-10-CM | POA: Diagnosis not present

## 2020-04-01 DIAGNOSIS — J410 Simple chronic bronchitis: Secondary | ICD-10-CM | POA: Diagnosis not present

## 2020-04-01 DIAGNOSIS — I10 Essential (primary) hypertension: Secondary | ICD-10-CM | POA: Diagnosis not present

## 2020-05-02 DIAGNOSIS — I10 Essential (primary) hypertension: Secondary | ICD-10-CM | POA: Diagnosis not present

## 2020-05-02 DIAGNOSIS — J410 Simple chronic bronchitis: Secondary | ICD-10-CM | POA: Diagnosis not present

## 2020-07-15 ENCOUNTER — Other Ambulatory Visit: Payer: Self-pay

## 2020-07-15 ENCOUNTER — Ambulatory Visit (HOSPITAL_COMMUNITY)
Admission: RE | Admit: 2020-07-15 | Discharge: 2020-07-15 | Disposition: A | Discharge status: Home / Self Care | Payer: Medicare Other | Source: Ambulatory Visit | Attending: Internal Medicine | Admitting: Internal Medicine

## 2020-07-15 ENCOUNTER — Other Ambulatory Visit (HOSPITAL_COMMUNITY): Payer: Self-pay | Admitting: Radiology

## 2020-07-15 ENCOUNTER — Other Ambulatory Visit (HOSPITAL_COMMUNITY): Payer: Self-pay | Admitting: Internal Medicine

## 2020-07-15 DIAGNOSIS — J40 Bronchitis, not specified as acute or chronic: Secondary | ICD-10-CM | POA: Insufficient documentation

## 2020-07-15 DIAGNOSIS — F17290 Nicotine dependence, other tobacco product, uncomplicated: Secondary | ICD-10-CM | POA: Diagnosis not present

## 2020-07-15 DIAGNOSIS — Z1389 Encounter for screening for other disorder: Secondary | ICD-10-CM | POA: Diagnosis not present

## 2020-07-15 DIAGNOSIS — F17210 Nicotine dependence, cigarettes, uncomplicated: Secondary | ICD-10-CM | POA: Diagnosis not present

## 2020-07-15 DIAGNOSIS — J410 Simple chronic bronchitis: Secondary | ICD-10-CM

## 2020-07-15 DIAGNOSIS — I700 Atherosclerosis of aorta: Secondary | ICD-10-CM | POA: Diagnosis not present

## 2020-07-15 DIAGNOSIS — Z0001 Encounter for general adult medical examination with abnormal findings: Secondary | ICD-10-CM | POA: Diagnosis not present

## 2020-07-15 DIAGNOSIS — I10 Essential (primary) hypertension: Secondary | ICD-10-CM | POA: Diagnosis not present

## 2020-07-15 DIAGNOSIS — J42 Unspecified chronic bronchitis: Secondary | ICD-10-CM | POA: Diagnosis not present

## 2020-08-15 DIAGNOSIS — F17290 Nicotine dependence, other tobacco product, uncomplicated: Secondary | ICD-10-CM | POA: Diagnosis not present

## 2020-08-15 DIAGNOSIS — I10 Essential (primary) hypertension: Secondary | ICD-10-CM | POA: Diagnosis not present

## 2020-09-14 DIAGNOSIS — I10 Essential (primary) hypertension: Secondary | ICD-10-CM | POA: Diagnosis not present

## 2020-09-14 DIAGNOSIS — J410 Simple chronic bronchitis: Secondary | ICD-10-CM | POA: Diagnosis not present

## 2020-10-14 ENCOUNTER — Other Ambulatory Visit (HOSPITAL_COMMUNITY)
Admission: RE | Admit: 2020-10-14 | Discharge: 2020-10-14 | Disposition: A | Discharge status: Home / Self Care | Payer: Medicare Other | Source: Ambulatory Visit | Attending: Internal Medicine | Admitting: Internal Medicine

## 2020-10-15 DIAGNOSIS — F17290 Nicotine dependence, other tobacco product, uncomplicated: Secondary | ICD-10-CM | POA: Diagnosis not present

## 2020-10-15 DIAGNOSIS — I10 Essential (primary) hypertension: Secondary | ICD-10-CM | POA: Diagnosis not present

## 2020-10-18 ENCOUNTER — Ambulatory Visit (HOSPITAL_COMMUNITY): Payer: Medicare Other

## 2020-11-16 DIAGNOSIS — F17210 Nicotine dependence, cigarettes, uncomplicated: Secondary | ICD-10-CM | POA: Diagnosis not present

## 2020-11-16 DIAGNOSIS — I10 Essential (primary) hypertension: Secondary | ICD-10-CM | POA: Diagnosis not present

## 2020-12-17 DIAGNOSIS — I10 Essential (primary) hypertension: Secondary | ICD-10-CM | POA: Diagnosis not present

## 2020-12-17 DIAGNOSIS — F17290 Nicotine dependence, other tobacco product, uncomplicated: Secondary | ICD-10-CM | POA: Diagnosis not present

## 2021-01-17 DIAGNOSIS — I10 Essential (primary) hypertension: Secondary | ICD-10-CM | POA: Diagnosis not present

## 2021-01-17 DIAGNOSIS — F17290 Nicotine dependence, other tobacco product, uncomplicated: Secondary | ICD-10-CM | POA: Diagnosis not present

## 2021-02-17 DIAGNOSIS — Z23 Encounter for immunization: Secondary | ICD-10-CM | POA: Diagnosis not present

## 2021-02-17 DIAGNOSIS — F17290 Nicotine dependence, other tobacco product, uncomplicated: Secondary | ICD-10-CM | POA: Diagnosis not present

## 2021-02-17 DIAGNOSIS — J410 Simple chronic bronchitis: Secondary | ICD-10-CM | POA: Diagnosis not present

## 2021-02-17 DIAGNOSIS — I10 Essential (primary) hypertension: Secondary | ICD-10-CM | POA: Diagnosis not present

## 2021-02-17 DIAGNOSIS — R000 Tachycardia, unspecified: Secondary | ICD-10-CM | POA: Diagnosis not present

## 2021-03-20 DIAGNOSIS — F17290 Nicotine dependence, other tobacco product, uncomplicated: Secondary | ICD-10-CM | POA: Diagnosis not present

## 2021-03-20 DIAGNOSIS — I10 Essential (primary) hypertension: Secondary | ICD-10-CM | POA: Diagnosis not present

## 2021-04-19 DIAGNOSIS — I10 Essential (primary) hypertension: Secondary | ICD-10-CM | POA: Diagnosis not present

## 2021-04-19 DIAGNOSIS — F17290 Nicotine dependence, other tobacco product, uncomplicated: Secondary | ICD-10-CM | POA: Diagnosis not present

## 2021-05-20 DIAGNOSIS — F17290 Nicotine dependence, other tobacco product, uncomplicated: Secondary | ICD-10-CM | POA: Diagnosis not present

## 2021-05-20 DIAGNOSIS — I10 Essential (primary) hypertension: Secondary | ICD-10-CM | POA: Diagnosis not present

## 2021-06-20 DIAGNOSIS — F17210 Nicotine dependence, cigarettes, uncomplicated: Secondary | ICD-10-CM | POA: Diagnosis not present

## 2021-06-20 DIAGNOSIS — I10 Essential (primary) hypertension: Secondary | ICD-10-CM | POA: Diagnosis not present

## 2021-07-18 DIAGNOSIS — F17290 Nicotine dependence, other tobacco product, uncomplicated: Secondary | ICD-10-CM | POA: Diagnosis not present

## 2021-07-18 DIAGNOSIS — I10 Essential (primary) hypertension: Secondary | ICD-10-CM | POA: Diagnosis not present

## 2021-08-18 ENCOUNTER — Other Ambulatory Visit (HOSPITAL_COMMUNITY): Payer: Self-pay | Admitting: Internal Medicine

## 2021-08-18 DIAGNOSIS — Z1231 Encounter for screening mammogram for malignant neoplasm of breast: Secondary | ICD-10-CM

## 2021-08-18 DIAGNOSIS — I10 Essential (primary) hypertension: Secondary | ICD-10-CM | POA: Diagnosis not present

## 2021-08-18 DIAGNOSIS — F17210 Nicotine dependence, cigarettes, uncomplicated: Secondary | ICD-10-CM | POA: Diagnosis not present

## 2021-08-18 DIAGNOSIS — J410 Simple chronic bronchitis: Secondary | ICD-10-CM | POA: Diagnosis not present

## 2021-08-18 DIAGNOSIS — F17290 Nicotine dependence, other tobacco product, uncomplicated: Secondary | ICD-10-CM | POA: Diagnosis not present

## 2021-08-18 DIAGNOSIS — Z23 Encounter for immunization: Secondary | ICD-10-CM | POA: Diagnosis not present

## 2021-08-18 DIAGNOSIS — Z0001 Encounter for general adult medical examination with abnormal findings: Secondary | ICD-10-CM | POA: Diagnosis not present

## 2021-09-17 DIAGNOSIS — I10 Essential (primary) hypertension: Secondary | ICD-10-CM | POA: Diagnosis not present

## 2021-09-17 DIAGNOSIS — F17290 Nicotine dependence, other tobacco product, uncomplicated: Secondary | ICD-10-CM | POA: Diagnosis not present

## 2021-10-06 ENCOUNTER — Ambulatory Visit (HOSPITAL_COMMUNITY): Payer: Medicare Other

## 2021-10-18 DIAGNOSIS — J410 Simple chronic bronchitis: Secondary | ICD-10-CM | POA: Diagnosis not present

## 2021-10-18 DIAGNOSIS — I10 Essential (primary) hypertension: Secondary | ICD-10-CM | POA: Diagnosis not present

## 2021-11-03 ENCOUNTER — Ambulatory Visit (HOSPITAL_COMMUNITY)
Admission: RE | Admit: 2021-11-03 | Discharge: 2021-11-03 | Disposition: A | Discharge status: Home / Self Care | Payer: Medicare Other | Source: Ambulatory Visit | Attending: Internal Medicine | Admitting: Internal Medicine

## 2021-11-03 DIAGNOSIS — I10 Essential (primary) hypertension: Secondary | ICD-10-CM | POA: Diagnosis not present

## 2021-11-03 DIAGNOSIS — Z1231 Encounter for screening mammogram for malignant neoplasm of breast: Secondary | ICD-10-CM | POA: Diagnosis not present

## 2021-11-03 DIAGNOSIS — E559 Vitamin D deficiency, unspecified: Secondary | ICD-10-CM | POA: Diagnosis not present

## 2021-11-03 DIAGNOSIS — Z1159 Encounter for screening for other viral diseases: Secondary | ICD-10-CM | POA: Diagnosis not present

## 2021-11-03 DIAGNOSIS — Z0001 Encounter for general adult medical examination with abnormal findings: Secondary | ICD-10-CM | POA: Diagnosis not present

## 2021-11-17 DIAGNOSIS — I10 Essential (primary) hypertension: Secondary | ICD-10-CM | POA: Diagnosis not present

## 2021-11-17 DIAGNOSIS — J410 Simple chronic bronchitis: Secondary | ICD-10-CM | POA: Diagnosis not present

## 2021-12-18 DIAGNOSIS — F17210 Nicotine dependence, cigarettes, uncomplicated: Secondary | ICD-10-CM | POA: Diagnosis not present

## 2021-12-18 DIAGNOSIS — I10 Essential (primary) hypertension: Secondary | ICD-10-CM | POA: Diagnosis not present

## 2022-01-18 DIAGNOSIS — I10 Essential (primary) hypertension: Secondary | ICD-10-CM | POA: Diagnosis not present

## 2022-01-18 DIAGNOSIS — F17210 Nicotine dependence, cigarettes, uncomplicated: Secondary | ICD-10-CM | POA: Diagnosis not present

## 2022-03-02 DIAGNOSIS — Z23 Encounter for immunization: Secondary | ICD-10-CM | POA: Diagnosis not present

## 2022-03-02 DIAGNOSIS — F17210 Nicotine dependence, cigarettes, uncomplicated: Secondary | ICD-10-CM | POA: Diagnosis not present

## 2022-03-02 DIAGNOSIS — F17200 Nicotine dependence, unspecified, uncomplicated: Secondary | ICD-10-CM | POA: Diagnosis not present

## 2022-03-02 DIAGNOSIS — I10 Essential (primary) hypertension: Secondary | ICD-10-CM | POA: Diagnosis not present

## 2022-03-02 DIAGNOSIS — E559 Vitamin D deficiency, unspecified: Secondary | ICD-10-CM | POA: Diagnosis not present

## 2022-03-02 DIAGNOSIS — J410 Simple chronic bronchitis: Secondary | ICD-10-CM | POA: Diagnosis not present

## 2022-04-01 DIAGNOSIS — E782 Mixed hyperlipidemia: Secondary | ICD-10-CM | POA: Diagnosis not present

## 2022-04-01 DIAGNOSIS — I10 Essential (primary) hypertension: Secondary | ICD-10-CM | POA: Diagnosis not present

## 2022-05-02 DIAGNOSIS — E782 Mixed hyperlipidemia: Secondary | ICD-10-CM | POA: Diagnosis not present

## 2022-05-02 DIAGNOSIS — I10 Essential (primary) hypertension: Secondary | ICD-10-CM | POA: Diagnosis not present

## 2022-06-02 DIAGNOSIS — E782 Mixed hyperlipidemia: Secondary | ICD-10-CM | POA: Diagnosis not present

## 2022-06-02 DIAGNOSIS — I10 Essential (primary) hypertension: Secondary | ICD-10-CM | POA: Diagnosis not present

## 2022-06-29 IMAGING — DX DG CHEST 2V
2 series · 2 of 2 positions shown · non-contrast
Comparison: Chest CT March 23, 2016

CLINICAL DATA: Bronchitis

EXAM:
CHEST - 2 VIEW

[chest pa]
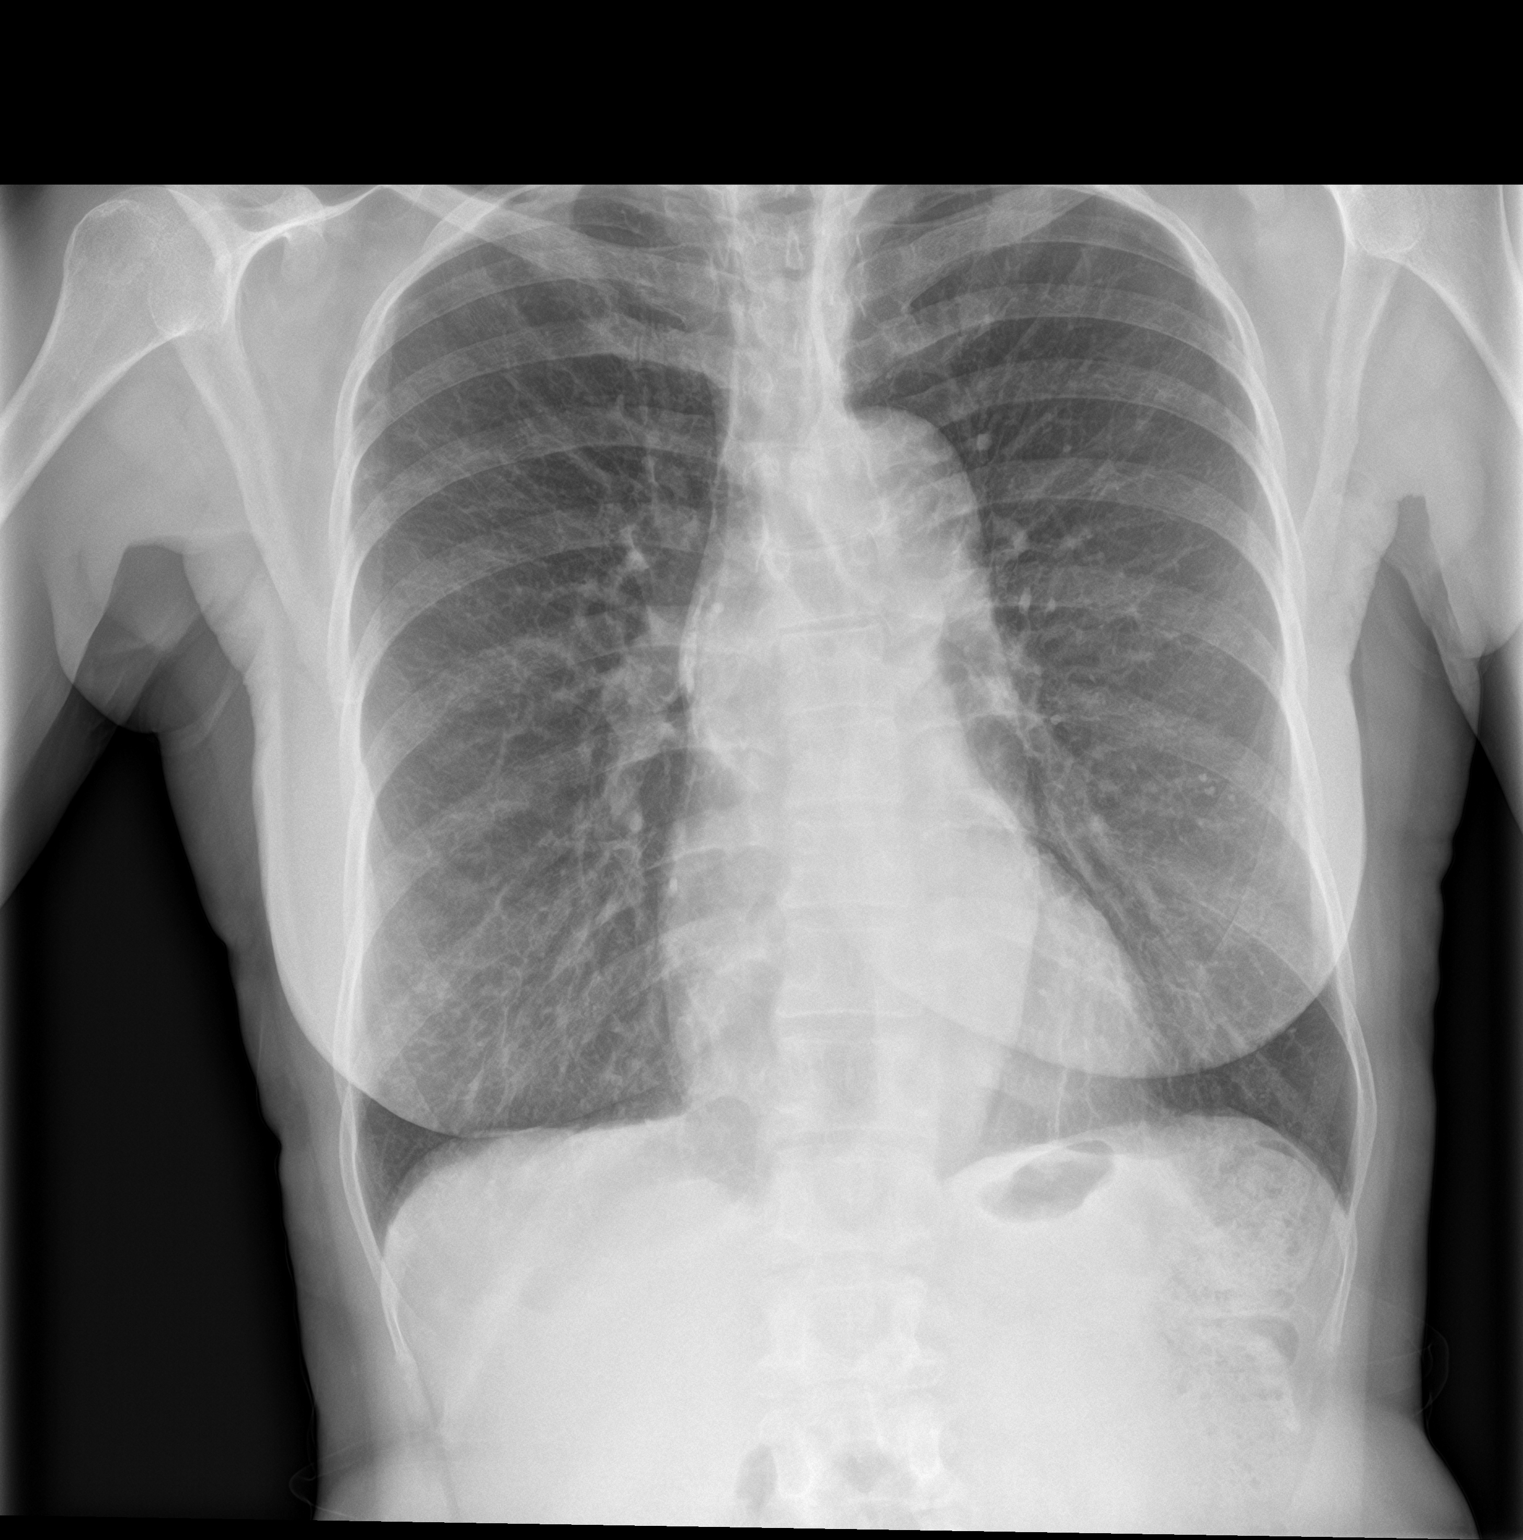

[chest lat]
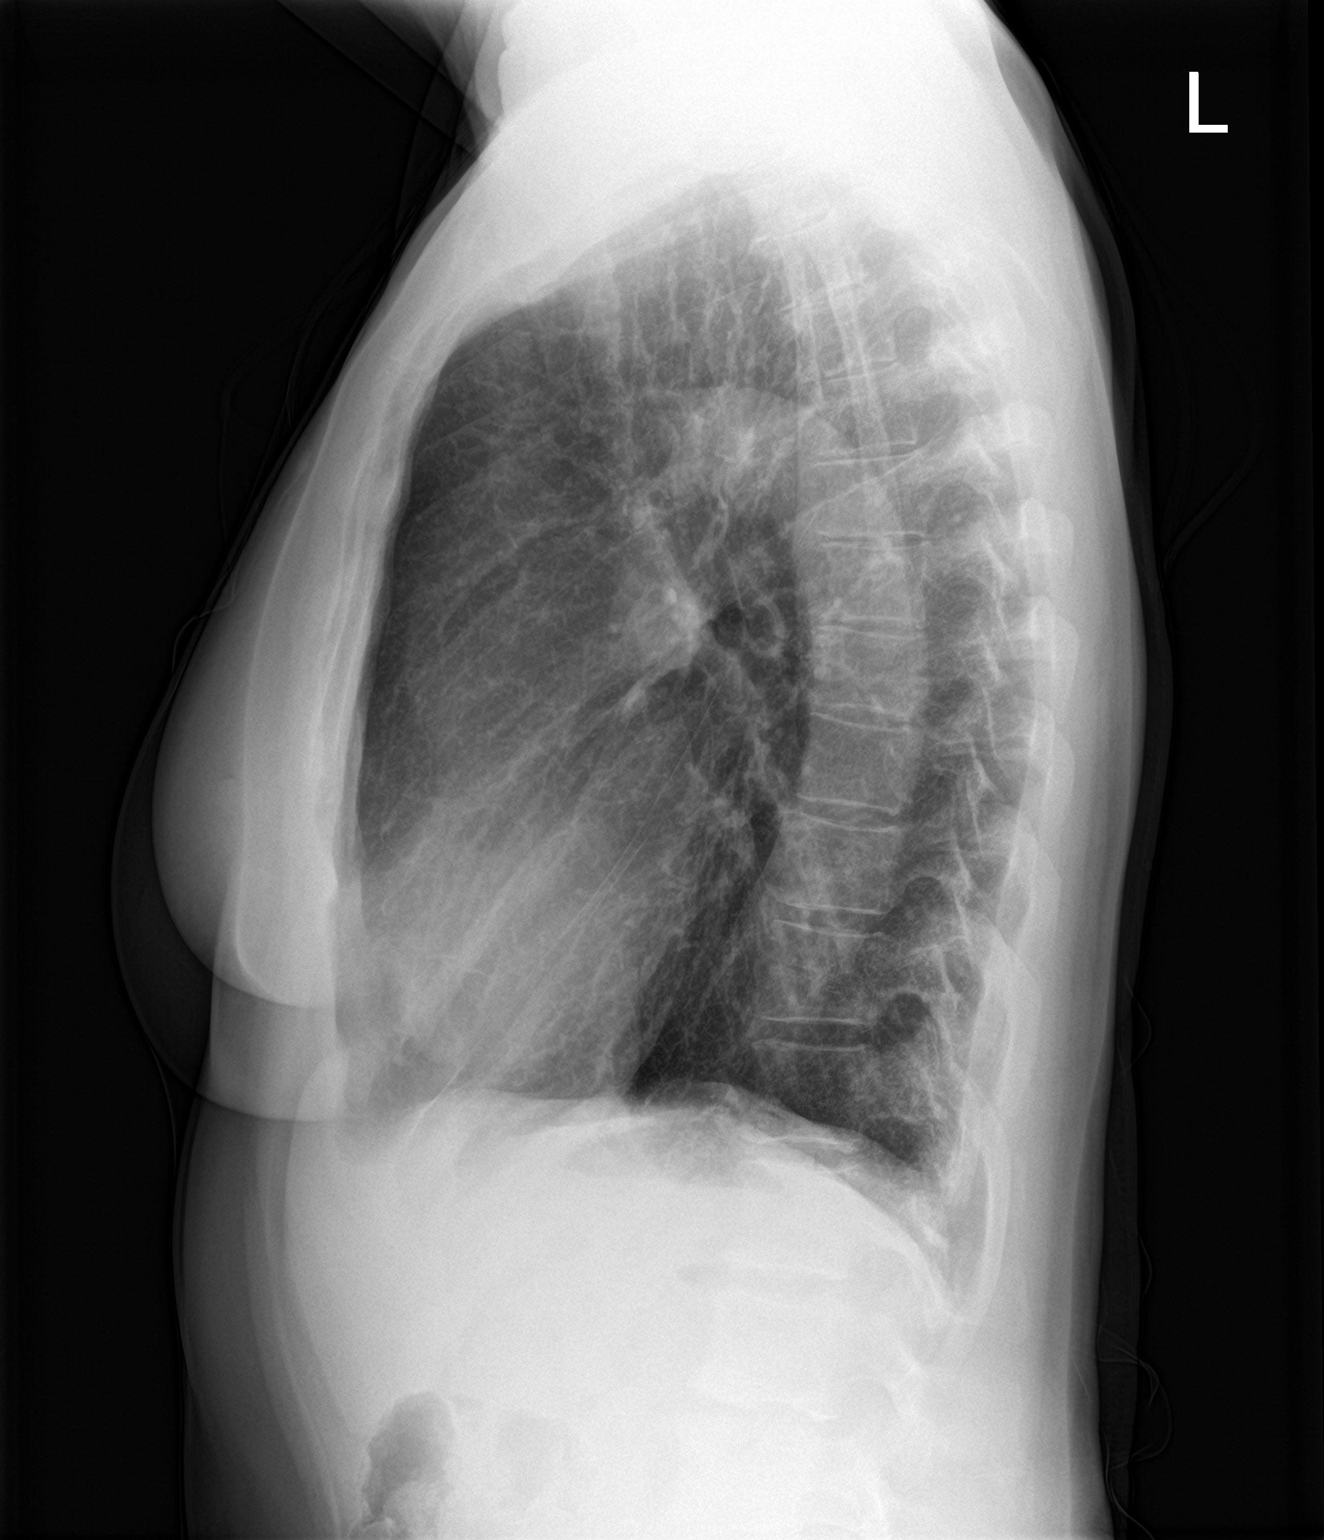

[2 of 2 positions shown; findings below may reference images not displayed]

FINDINGS: The heart size and mediastinal contours are within normal limits.
Aortic atherosclerosis. Chronic bronchitic lung changes. No pleural
effusion. No pneumothorax. The visualized skeletal structures are
unremarkable.
IMPRESSION: No active cardiopulmonary disease.

## 2022-07-01 DIAGNOSIS — I10 Essential (primary) hypertension: Secondary | ICD-10-CM | POA: Diagnosis not present

## 2022-07-01 DIAGNOSIS — E782 Mixed hyperlipidemia: Secondary | ICD-10-CM | POA: Diagnosis not present

## 2022-08-01 DIAGNOSIS — E782 Mixed hyperlipidemia: Secondary | ICD-10-CM | POA: Diagnosis not present

## 2022-08-01 DIAGNOSIS — I10 Essential (primary) hypertension: Secondary | ICD-10-CM | POA: Diagnosis not present

## 2022-08-31 DIAGNOSIS — E782 Mixed hyperlipidemia: Secondary | ICD-10-CM | POA: Diagnosis not present

## 2022-08-31 DIAGNOSIS — I10 Essential (primary) hypertension: Secondary | ICD-10-CM | POA: Diagnosis not present

## 2022-10-01 DIAGNOSIS — E782 Mixed hyperlipidemia: Secondary | ICD-10-CM | POA: Diagnosis not present

## 2022-10-01 DIAGNOSIS — I10 Essential (primary) hypertension: Secondary | ICD-10-CM | POA: Diagnosis not present

## 2022-11-09 DIAGNOSIS — E782 Mixed hyperlipidemia: Secondary | ICD-10-CM | POA: Diagnosis not present

## 2022-11-09 DIAGNOSIS — I10 Essential (primary) hypertension: Secondary | ICD-10-CM | POA: Diagnosis not present

## 2022-11-09 DIAGNOSIS — F17210 Nicotine dependence, cigarettes, uncomplicated: Secondary | ICD-10-CM | POA: Diagnosis not present

## 2022-11-09 DIAGNOSIS — Z0001 Encounter for general adult medical examination with abnormal findings: Secondary | ICD-10-CM | POA: Diagnosis not present

## 2022-11-09 DIAGNOSIS — F17200 Nicotine dependence, unspecified, uncomplicated: Secondary | ICD-10-CM | POA: Diagnosis not present

## 2022-11-09 DIAGNOSIS — E559 Vitamin D deficiency, unspecified: Secondary | ICD-10-CM | POA: Diagnosis not present

## 2022-11-09 DIAGNOSIS — Z1389 Encounter for screening for other disorder: Secondary | ICD-10-CM | POA: Diagnosis not present

## 2022-11-09 DIAGNOSIS — J410 Simple chronic bronchitis: Secondary | ICD-10-CM | POA: Diagnosis not present

## 2022-12-10 DIAGNOSIS — E782 Mixed hyperlipidemia: Secondary | ICD-10-CM | POA: Diagnosis not present

## 2022-12-10 DIAGNOSIS — I10 Essential (primary) hypertension: Secondary | ICD-10-CM | POA: Diagnosis not present

## 2022-12-31 ENCOUNTER — Other Ambulatory Visit: Payer: Self-pay

## 2022-12-31 ENCOUNTER — Emergency Department (HOSPITAL_COMMUNITY): Payer: Medicare Other

## 2022-12-31 ENCOUNTER — Emergency Department (HOSPITAL_COMMUNITY)
Admission: EM | Admit: 2022-12-31 | Discharge: 2022-12-31 | Disposition: A | Discharge status: Home / Self Care | Payer: Medicare Other | Attending: Emergency Medicine | Admitting: Emergency Medicine

## 2022-12-31 DIAGNOSIS — J3489 Other specified disorders of nose and nasal sinuses: Secondary | ICD-10-CM | POA: Diagnosis not present

## 2022-12-31 DIAGNOSIS — Z1152 Encounter for screening for COVID-19: Secondary | ICD-10-CM | POA: Diagnosis not present

## 2022-12-31 DIAGNOSIS — R059 Cough, unspecified: Secondary | ICD-10-CM | POA: Diagnosis not present

## 2022-12-31 DIAGNOSIS — R067 Sneezing: Secondary | ICD-10-CM

## 2022-12-31 DIAGNOSIS — J42 Unspecified chronic bronchitis: Secondary | ICD-10-CM | POA: Diagnosis not present

## 2022-12-31 LAB — SARS CORONAVIRUS 2 BY RT PCR: SARS Coronavirus 2 by RT PCR: NEGATIVE

## 2022-12-31 MED ORDER — CETIRIZINE HCL 10 MG PO TABS: 10.0000 mg | ORAL_TABLET | Freq: Every day | ORAL | 0 refills | Status: AC

## 2022-12-31 MED ORDER — FLUTICASONE PROPIONATE 50 MCG/ACT NA SUSP: 2.0000 | Freq: Every day | NASAL | 0 refills | Status: AC

## 2022-12-31 NOTE — ED Triage Notes (Signed)
Pt presents with nasal drainage and cough, that started on Friday, denies being around anyone sick.

## 2022-12-31 NOTE — ED Provider Notes (Signed)
[place] EMERGENCY DEPARTMENT AT [facility] Provider Note   CSN: [id number] Arrival date & time: 12/31/22  1035    History  Chief Complaint  Patient presents with   Nasal Congestion    [name] is a 72 y.o. female here for evaluation of congestion, rhinorrhea and sneezing.  Began after she was mowing the yard on Thursday.  No cough, chest pain, shortness of breath, headache, fever, neck pain, abdominal pain, nausea or vomiting.  No known sick contacts.  Does have seasonal allergies.  Was not wearing a mask when she was mowing the yard.  HPI     Home Medications Prior to Admission medications   Medication Sig Start Date End Date Taking? Authorizing Provider  cetirizine (ZYRTEC ALLERGY) 10 MG tablet Take 1 tablet (10 mg total) by mouth daily. 12/31/22  Yes Maleta Pacha A, PA-C  fluticasone (FLONASE) 50 MCG/ACT nasal spray Place 2 sprays into both nostrils daily. 12/31/22  Yes Arlys Scatena A, PA-C  acetaminophen (TYLENOL) 500 MG tablet Take 500 mg by mouth every 6 (six) hours as needed.    [provider]  amLODipine-benazepril (LOTREL) 10-20 MG capsule Take 1 capsule by mouth daily.    [provider]  diphenhydrAMINE (BENADRYL) 50 MG capsule Take 100 mg by mouth every 8 (eight) hours as needed for itching or allergies.    [provider]  famotidine (PEPCID) 20 MG tablet Take 1 tablet (20 mg total) by mouth 2 (two) times daily. 12/20/16   Bethann Berkshire, MD  predniSONE (DELTASONE) 20 MG tablet 2 tabs po daily x 3 days 12/20/16   Bethann Berkshire, MD      Allergies    Sulfonamide derivatives    Review of Systems   Review of Systems  Constitutional: Negative.   HENT:  Positive for congestion, rhinorrhea and sneezing. Negative for dental problem, drooling, ear discharge, ear pain, facial swelling, hearing loss, mouth sores, nosebleeds, postnasal drip, sinus pressure, sinus pain, sore throat, tinnitus, trouble swallowing and voice  change.   Respiratory: Negative.    Cardiovascular: Negative.   Gastrointestinal: Negative.   Genitourinary: Negative.   Musculoskeletal: Negative.   Skin: Negative.   Neurological: Negative.   All other systems reviewed and are negative.   Physical Exam Updated Vital Signs BP (!) 148/78   Pulse 70   Temp 98 F (36.7 C)   Resp 16   Ht 5\' 4"  (1.626 m)   Wt 59 kg   SpO2 99%   BMI 22.31 kg/m  Physical Exam Vitals and nursing note reviewed.  Constitutional:      General: She is not in acute distress.    Appearance: She is well-developed. She is not ill-appearing, toxic-appearing or diaphoretic.  HENT:     Head: Atraumatic.     Nose: Congestion and rhinorrhea present.     Comments: Clear rhinorrhea bil    Mouth/Throat:     Mouth: Mucous membranes are moist.  Eyes:     Pupils: Pupils are equal, round, and reactive to light.  Cardiovascular:     Rate and Rhythm: Normal rate.     Pulses: Normal pulses.     Heart sounds: Normal heart sounds.  Pulmonary:     Effort: Pulmonary effort is normal. No respiratory distress.     Breath sounds: Normal breath sounds.     Comments: Clear bilaterally, speaks in full sentences that difficulty Abdominal:     General: Bowel sounds are normal. There is no distension.  Palpations: Abdomen is soft.     Tenderness: There is no abdominal tenderness. There is no right CVA tenderness, left CVA tenderness or guarding.  Musculoskeletal:        General: Normal range of motion.     Cervical back: Normal range of motion.  Skin:    General: Skin is warm and dry.  Neurological:     General: No focal deficit present.     Mental Status: She is alert.  Psychiatric:        Mood and Affect: Mood normal.    ED Results / Procedures / Treatments   Labs (all labs ordered are listed, but only abnormal results are displayed) Labs Reviewed  SARS CORONAVIRUS 2 BY RT PCR    EKG None  Radiology DG Chest Portable 1 View  Result Date:  12/31/2022 CLINICAL DATA:  Cough and nasal drainage. EXAM: PORTABLE CHEST 1 VIEW COMPARISON:  Chest radiographs 07/15/2020 FINDINGS: The cardiac silhouette is borderline enlarged. The lungs are hyperinflated with similar appearance of peribronchial thickening and interstitial coarsening compared to the prior study. Scarring is noted in the right lung base. No lobar consolidation, overt pulmonary edema, sizable pleural effusion, or pneumothorax is identified. No acute osseous abnormality is seen. IMPRESSION: Chronic bronchitic changes without evidence of acute cardiopulmonary process. Electronically Signed   By: Sebastian Ache M.D.   On: 12/31/2022 12:49    Procedures Procedures    Medications Ordered in ED Medications - No data to display  ED Course/ Medical Decision Making/ A&P   72 year old here for evaluation of clear rhinorrhea, sneezing that started after mowing the lawn on Thursday.  No chest pain, shortness of breath.  No clinical evidence of VTE.  Posterior pharynx clear.  Heart and lungs clear.  Abdomen soft, tender.  She does have some clear rhinorrhea bilaterally, nontender over frontal or maxillary sinuses.  Plan chest x-ray, COVID test  Labs and imaging personally viewed interpreted COVID-negative Chest x-ray chronic bronchitic changes  Patient reassessed.  Likely allergic rhinitis versus viral infection, write for a few medications to help with symptomatic management.  At this time low suspicion for sepsis, PTA, RPA, bacterial infection, venous sinus thrombosis, strep pharyngitis, ACS, PE, dissection, pneumothorax.  The patient has been appropriately medically screened and/or stabilized in the ED. I have low suspicion for any other emergent medical condition which would require further screening, evaluation or treatment in the ED or require inpatient management.  Patient is hemodynamically stable and in no acute distress.  Patient able to ambulate in department prior to ED.   Evaluation does not show acute pathology that would require ongoing or additional emergent interventions while in the emergency department or further inpatient treatment.  I have discussed the diagnosis with the patient and answered all questions.  Pain is been managed while in the emergency department and patient has no further complaints prior to discharge.  Patient is comfortable with plan discussed in room and is stable for discharge at this time.  I have discussed strict return precautions for returning to the emergency department.  Patient was encouraged to follow-up with PCP/specialist refer to at discharge.                                 Medical Decision Making Amount and/or Complexity of Data Reviewed External Data Reviewed: labs, radiology and notes. Labs: ordered. Decision-making details documented in ED Course. Radiology: ordered and independent interpretation performed. Decision-making details  documented in ED Course.  Risk OTC drugs. Prescription drug management. Diagnosis or treatment significantly limited by social determinants of health.           Final Clinical Impression(s) / ED Diagnoses Final diagnoses:  Rhinorrhea  Sneezing    Rx / DC Orders ED Discharge Orders          Ordered    fluticasone (FLONASE) 50 MCG/ACT nasal spray  Daily        12/31/22 1356    cetirizine (ZYRTEC ALLERGY) 10 MG tablet  Daily        12/31/22 1356              Alannah Averhart A, PA-C 12/31/22 1423    Derwood Kaplan, MD 01/03/23 2206

## 2022-12-31 NOTE — Discharge Instructions (Signed)
Your chest x-ray and COVID test were negative  I am starting you on antihistamine as well as a nasal spray, take as prescribed  Follow-up outpatient, return for new or worsening symptoms

## 2023-01-07 ENCOUNTER — Other Ambulatory Visit (HOSPITAL_COMMUNITY): Payer: Self-pay | Admitting: Internal Medicine

## 2023-01-07 DIAGNOSIS — Z1231 Encounter for screening mammogram for malignant neoplasm of breast: Secondary | ICD-10-CM

## 2023-01-10 DIAGNOSIS — E782 Mixed hyperlipidemia: Secondary | ICD-10-CM | POA: Diagnosis not present

## 2023-01-10 DIAGNOSIS — I10 Essential (primary) hypertension: Secondary | ICD-10-CM | POA: Diagnosis not present

## 2023-01-25 ENCOUNTER — Ambulatory Visit (HOSPITAL_COMMUNITY): Payer: Medicare Other

## 2023-01-28 ENCOUNTER — Telehealth: Payer: Self-pay

## 2023-01-28 NOTE — Telephone Encounter (Signed)
Transition Care Management Unsuccessful Follow-up Telephone Call  Date of discharge and from where:  [facility]  Attempts:  1st Attempt  Reason for unsuccessful TCM follow-up call:  No answer/busy   Lenard Forth [place]  [facility], [facility] Guide, Phone: [phone] Website: [email or web address]

## 2023-01-29 ENCOUNTER — Telehealth: Payer: Self-pay

## 2023-01-29 NOTE — Telephone Encounter (Signed)
Transition Care Management Unsuccessful Follow-up Telephone Call  Date of discharge and from where:  [name]  Attempts:  2nd Attempt  Reason for unsuccessful TCM follow-up call:  No answer/busy   [name] [place]  [name], [name] Guide, Phone: [phone] Website: [email or web address]

## 2023-02-01 ENCOUNTER — Ambulatory Visit (HOSPITAL_COMMUNITY)
Admission: RE | Admit: 2023-02-01 | Discharge: 2023-02-01 | Disposition: A | Discharge status: Home / Self Care | Payer: Medicare Other | Source: Ambulatory Visit | Attending: Internal Medicine | Admitting: Internal Medicine

## 2023-02-01 ENCOUNTER — Encounter (HOSPITAL_COMMUNITY): Payer: Self-pay

## 2023-02-01 DIAGNOSIS — Z1231 Encounter for screening mammogram for malignant neoplasm of breast: Secondary | ICD-10-CM

## 2023-02-09 DIAGNOSIS — I10 Essential (primary) hypertension: Secondary | ICD-10-CM | POA: Diagnosis not present

## 2023-02-09 DIAGNOSIS — E782 Mixed hyperlipidemia: Secondary | ICD-10-CM | POA: Diagnosis not present

## 2023-03-12 DIAGNOSIS — E782 Mixed hyperlipidemia: Secondary | ICD-10-CM | POA: Diagnosis not present

## 2023-03-12 DIAGNOSIS — I10 Essential (primary) hypertension: Secondary | ICD-10-CM | POA: Diagnosis not present

## 2023-03-29 DIAGNOSIS — F17200 Nicotine dependence, unspecified, uncomplicated: Secondary | ICD-10-CM | POA: Diagnosis not present

## 2023-03-29 DIAGNOSIS — E782 Mixed hyperlipidemia: Secondary | ICD-10-CM | POA: Diagnosis not present

## 2023-03-29 DIAGNOSIS — J410 Simple chronic bronchitis: Secondary | ICD-10-CM | POA: Diagnosis not present

## 2023-03-29 DIAGNOSIS — I10 Essential (primary) hypertension: Secondary | ICD-10-CM | POA: Diagnosis not present

## 2023-03-29 DIAGNOSIS — Z23 Encounter for immunization: Secondary | ICD-10-CM | POA: Diagnosis not present

## 2023-04-29 DIAGNOSIS — I10 Essential (primary) hypertension: Secondary | ICD-10-CM | POA: Diagnosis not present

## 2023-04-29 DIAGNOSIS — E782 Mixed hyperlipidemia: Secondary | ICD-10-CM | POA: Diagnosis not present

## 2023-05-30 DIAGNOSIS — I10 Essential (primary) hypertension: Secondary | ICD-10-CM | POA: Diagnosis not present

## 2023-05-30 DIAGNOSIS — E782 Mixed hyperlipidemia: Secondary | ICD-10-CM | POA: Diagnosis not present

## 2023-06-27 DIAGNOSIS — I10 Essential (primary) hypertension: Secondary | ICD-10-CM | POA: Diagnosis not present

## 2023-06-27 DIAGNOSIS — E782 Mixed hyperlipidemia: Secondary | ICD-10-CM | POA: Diagnosis not present

## 2023-07-28 DIAGNOSIS — I10 Essential (primary) hypertension: Secondary | ICD-10-CM | POA: Diagnosis not present

## 2023-07-28 DIAGNOSIS — E782 Mixed hyperlipidemia: Secondary | ICD-10-CM | POA: Diagnosis not present

## 2023-08-27 DIAGNOSIS — E782 Mixed hyperlipidemia: Secondary | ICD-10-CM | POA: Diagnosis not present

## 2023-08-27 DIAGNOSIS — I10 Essential (primary) hypertension: Secondary | ICD-10-CM | POA: Diagnosis not present

## 2023-09-27 DIAGNOSIS — E782 Mixed hyperlipidemia: Secondary | ICD-10-CM | POA: Diagnosis not present

## 2023-09-27 DIAGNOSIS — I10 Essential (primary) hypertension: Secondary | ICD-10-CM | POA: Diagnosis not present

## 2023-11-01 DIAGNOSIS — I10 Essential (primary) hypertension: Secondary | ICD-10-CM | POA: Diagnosis not present

## 2023-11-01 DIAGNOSIS — J410 Simple chronic bronchitis: Secondary | ICD-10-CM | POA: Diagnosis not present

## 2023-11-01 DIAGNOSIS — F17210 Nicotine dependence, cigarettes, uncomplicated: Secondary | ICD-10-CM | POA: Diagnosis not present

## 2023-11-01 DIAGNOSIS — E559 Vitamin D deficiency, unspecified: Secondary | ICD-10-CM | POA: Diagnosis not present

## 2023-11-01 DIAGNOSIS — Z0001 Encounter for general adult medical examination with abnormal findings: Secondary | ICD-10-CM | POA: Diagnosis not present

## 2023-11-01 DIAGNOSIS — F17200 Nicotine dependence, unspecified, uncomplicated: Secondary | ICD-10-CM | POA: Diagnosis not present

## 2023-11-01 DIAGNOSIS — Z1389 Encounter for screening for other disorder: Secondary | ICD-10-CM | POA: Diagnosis not present

## 2023-11-01 DIAGNOSIS — E782 Mixed hyperlipidemia: Secondary | ICD-10-CM | POA: Diagnosis not present

## 2023-11-06 ENCOUNTER — Other Ambulatory Visit (HOSPITAL_COMMUNITY): Payer: Self-pay | Admitting: Internal Medicine

## 2023-11-06 DIAGNOSIS — Z1231 Encounter for screening mammogram for malignant neoplasm of breast: Secondary | ICD-10-CM

## 2023-12-04 DIAGNOSIS — I10 Essential (primary) hypertension: Secondary | ICD-10-CM | POA: Diagnosis not present

## 2023-12-04 DIAGNOSIS — E782 Mixed hyperlipidemia: Secondary | ICD-10-CM | POA: Diagnosis not present

## 2024-01-04 DIAGNOSIS — I10 Essential (primary) hypertension: Secondary | ICD-10-CM | POA: Diagnosis not present

## 2024-01-04 DIAGNOSIS — E782 Mixed hyperlipidemia: Secondary | ICD-10-CM | POA: Diagnosis not present

## 2024-02-03 DIAGNOSIS — I10 Essential (primary) hypertension: Secondary | ICD-10-CM | POA: Diagnosis not present

## 2024-02-03 DIAGNOSIS — E782 Mixed hyperlipidemia: Secondary | ICD-10-CM | POA: Diagnosis not present

## 2024-02-07 ENCOUNTER — Encounter (HOSPITAL_COMMUNITY): Payer: Self-pay

## 2024-02-07 ENCOUNTER — Ambulatory Visit (HOSPITAL_COMMUNITY)
Admission: RE | Admit: 2024-02-07 | Discharge: 2024-02-07 | Disposition: A | Source: Ambulatory Visit | Attending: Internal Medicine | Admitting: Internal Medicine

## 2024-02-07 DIAGNOSIS — Z1231 Encounter for screening mammogram for malignant neoplasm of breast: Secondary | ICD-10-CM | POA: Insufficient documentation

## 2024-02-07 DIAGNOSIS — I10 Essential (primary) hypertension: Secondary | ICD-10-CM | POA: Diagnosis not present

## 2024-02-07 DIAGNOSIS — E782 Mixed hyperlipidemia: Secondary | ICD-10-CM | POA: Diagnosis not present

## 2024-02-07 DIAGNOSIS — E559 Vitamin D deficiency, unspecified: Secondary | ICD-10-CM | POA: Diagnosis not present
